# Patient Record
Sex: Male | Born: 1980 | Race: Black or African American | Hispanic: No | Smoking: Current every day smoker
Health system: Southern US, Community
[De-identification: ages and names within clinical notes are randomized; demographics above are authoritative.]

## PROBLEM LIST (undated history)

## (undated) HISTORY — PX: HERNIA REPAIR: SHX51

---

## 2014-07-10 ENCOUNTER — Encounter (HOSPITAL_BASED_OUTPATIENT_CLINIC_OR_DEPARTMENT_OTHER): Payer: Self-pay | Admitting: Emergency Medicine

## 2014-07-10 ENCOUNTER — Emergency Department (HOSPITAL_BASED_OUTPATIENT_CLINIC_OR_DEPARTMENT_OTHER): Payer: No Typology Code available for payment source

## 2014-07-10 ENCOUNTER — Emergency Department (HOSPITAL_BASED_OUTPATIENT_CLINIC_OR_DEPARTMENT_OTHER)
Admission: EM | Admit: 2014-07-10 | Discharge: 2014-07-10 | Disposition: A | Discharge status: Home / Self Care | Payer: No Typology Code available for payment source | Attending: Emergency Medicine | Admitting: Emergency Medicine

## 2014-07-10 DIAGNOSIS — Z79899 Other long term (current) drug therapy: Secondary | ICD-10-CM | POA: Insufficient documentation

## 2014-07-10 DIAGNOSIS — S0993XA Unspecified injury of face, initial encounter: Secondary | ICD-10-CM | POA: Insufficient documentation

## 2014-07-10 DIAGNOSIS — T07XXXA Unspecified multiple injuries, initial encounter: Secondary | ICD-10-CM | POA: Diagnosis not present

## 2014-07-10 DIAGNOSIS — F17200 Nicotine dependence, unspecified, uncomplicated: Secondary | ICD-10-CM | POA: Diagnosis not present

## 2014-07-10 DIAGNOSIS — Y9289 Other specified places as the place of occurrence of the external cause: Secondary | ICD-10-CM | POA: Diagnosis not present

## 2014-07-10 DIAGNOSIS — S46909A Unspecified injury of unspecified muscle, fascia and tendon at shoulder and upper arm level, unspecified arm, initial encounter: Secondary | ICD-10-CM | POA: Diagnosis not present

## 2014-07-10 DIAGNOSIS — Y9389 Activity, other specified: Secondary | ICD-10-CM | POA: Insufficient documentation

## 2014-07-10 DIAGNOSIS — Z791 Long term (current) use of non-steroidal anti-inflammatories (NSAID): Secondary | ICD-10-CM | POA: Insufficient documentation

## 2014-07-10 DIAGNOSIS — S199XXA Unspecified injury of neck, initial encounter: Secondary | ICD-10-CM

## 2014-07-10 DIAGNOSIS — IMO0002 Reserved for concepts with insufficient information to code with codable children: Secondary | ICD-10-CM | POA: Diagnosis present

## 2014-07-10 DIAGNOSIS — S39012A Strain of muscle, fascia and tendon of lower back, initial encounter: Secondary | ICD-10-CM

## 2014-07-10 DIAGNOSIS — S4980XA Other specified injuries of shoulder and upper arm, unspecified arm, initial encounter: Secondary | ICD-10-CM | POA: Diagnosis not present

## 2014-07-10 DIAGNOSIS — Z880 Allergy status to penicillin: Secondary | ICD-10-CM | POA: Insufficient documentation

## 2014-07-10 MED ORDER — NAPROXEN 500 MG PO TABS: 500.0000 mg | ORAL_TABLET | Freq: Two times a day (BID) | ORAL | Status: AC

## 2014-07-10 MED ORDER — IBUPROFEN 800 MG PO TABS
800.0000 mg | ORAL_TABLET | Freq: Once | ORAL | Status: AC
  Administered 2014-07-10: 800 mg via ORAL
  Filled 2014-07-10: qty 1

## 2014-07-10 MED ORDER — HYDROCODONE-ACETAMINOPHEN 5-325 MG PO TABS
1.0000 | ORAL_TABLET | Freq: Once | ORAL | Status: AC
  Administered 2014-07-10: 1 via ORAL
  Filled 2014-07-10: qty 1

## 2014-07-10 MED ORDER — IBUPROFEN 800 MG PO TABS: 800.0000 mg | ORAL_TABLET | Freq: Three times a day (TID) | ORAL | Status: AC

## 2014-07-10 MED ORDER — HYDROCODONE-ACETAMINOPHEN 5-325 MG PO TABS: 2.0000 | ORAL_TABLET | ORAL | Status: DC | PRN

## 2014-07-10 NOTE — Discharge Instructions (Signed)
Contusion °A contusion is a deep bruise. Contusions are the result of an injury that caused bleeding under the skin. The contusion may turn blue, purple, or yellow. Minor injuries will give you a painless contusion, but more severe contusions may stay painful and swollen for a few weeks.  °CAUSES  °A contusion is usually caused by a blow, trauma, or direct force to an area of the body. °SYMPTOMS  °· Swelling and redness of the injured area. °· Bruising of the injured area. °· Tenderness and soreness of the injured area. °· Pain. °DIAGNOSIS  °The diagnosis can be made by taking a history and physical exam. An X-ray, CT scan, or MRI may be needed to determine if there were any associated injuries, such as fractures. °TREATMENT  °Specific treatment will depend on what area of the body was injured. In general, the best treatment for a contusion is resting, icing, elevating, and applying cold compresses to the injured area. Over-the-counter medicines may also be recommended for pain control. Ask your caregiver what the best treatment is for your contusion. °HOME CARE INSTRUCTIONS  °· Put ice on the injured area. °¨ Put ice in a plastic bag. °¨ Place a towel between your skin and the bag. °¨ Leave the ice on for 15-20 minutes, 3-4 times a day, or as directed by your health care provider. °· Only take over-the-counter or prescription medicines for pain, discomfort, or fever as directed by your caregiver. Your caregiver may recommend avoiding anti-inflammatory medicines (aspirin, ibuprofen, and naproxen) for 48 hours because these medicines may increase bruising. °· Rest the injured area. °· If possible, elevate the injured area to reduce swelling. °SEEK IMMEDIATE MEDICAL CARE IF:  °· You have increased bruising or swelling. °· You have pain that is getting worse. °· Your swelling or pain is not relieved with medicines. °MAKE SURE YOU:  °· Understand these instructions. °· Will watch your condition. °· Will get help right  away if you are not doing well or get worse. °Document Released: 07/10/2005 Document Revised: 10/05/2013 Document Reviewed: 08/05/2011 °[employer]® Patient Information ©2015 [employer]. This information is not intended to replace advice given to you by your health care provider. Make sure you discuss any questions you have with your health care provider. °Lumbosacral Strain °Lumbosacral strain is a strain of any of the parts that make up your lumbosacral vertebrae. Your lumbosacral vertebrae are the bones that make up the lower third of your backbone. Your lumbosacral vertebrae are held together by muscles and tough, fibrous tissue (ligaments).  °CAUSES  °A sudden blow to your back can cause lumbosacral strain. Also, anything that causes an excessive stretch of the muscles in the low back can cause this strain. This is typically seen when people exert themselves strenuously, fall, lift heavy objects, bend, or crouch repeatedly. °RISK FACTORS °· Physically demanding work. °· Participation in pushing or pulling sports or sports that require a sudden twist of the back (tennis, golf, baseball). °· Weight lifting. °· Excessive lower back curvature. °· Forward-tilted pelvis. °· Weak back or abdominal muscles or both. °· Tight hamstrings. °SIGNS AND SYMPTOMS  °Lumbosacral strain may cause pain in the area of your injury or pain that moves (radiates) down your leg.  °DIAGNOSIS °Your health care provider can often diagnose lumbosacral strain through a physical exam. In some cases, you may need tests such as X-ray exams.  °TREATMENT  °Treatment for your lower back injury depends on many factors that your clinician will have to evaluate. However,   most treatment will include the use of anti-inflammatory medicines. °HOME CARE INSTRUCTIONS  °· Avoid hard physical activities (tennis, racquetball, waterskiing) if you are not in proper physical condition for it. This may aggravate or create problems. °· If you have a back problem,  avoid sports requiring sudden body movements. Swimming and walking are generally safer activities. °· Maintain good posture. °· Maintain a healthy weight. °· For acute conditions, you may put ice on the injured area. °¨ Put ice in a plastic bag. °¨ Place a towel between your skin and the bag. °¨ Leave the ice on for 20 minutes, 2-3 times a day. °· When the low back starts healing, stretching and strengthening exercises may be recommended. °SEEK MEDICAL CARE IF: °· Your back pain is getting worse. °· You experience severe back pain not relieved with medicines. °SEEK IMMEDIATE MEDICAL CARE IF:  °· You have numbness, tingling, weakness, or problems with the use of your arms or legs. °· There is a change in bowel or bladder control. °· You have increasing pain in any area of the body, including your belly (abdomen). °· You notice shortness of breath, dizziness, or feel faint. °· You feel sick to your stomach (nauseous), are throwing up (vomiting), or become sweaty. °· You notice discoloration of your toes or legs, or your feet get very cold. °MAKE SURE YOU:  °· Understand these instructions. °· Will watch your condition. °· Will get help right away if you are not doing well or get worse. °Document Released: 07/10/2005 Document Revised: 10/05/2013 Document Reviewed: 05/19/2013 °ExitCare® Patient Information ©2015 [employer]. This information is not intended to replace advice given to you by your health care provider. Make sure you discuss any questions you have with your health care provider. ° °
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
## 2014-07-10 NOTE — ED Notes (Signed)
Patient transported to X-ray 
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
## 2015-05-02 ENCOUNTER — Encounter (HOSPITAL_BASED_OUTPATIENT_CLINIC_OR_DEPARTMENT_OTHER): Payer: Self-pay | Admitting: *Deleted

## 2015-05-02 ENCOUNTER — Emergency Department (HOSPITAL_BASED_OUTPATIENT_CLINIC_OR_DEPARTMENT_OTHER)
Admission: EM | Admit: 2015-05-02 | Discharge: 2015-05-02 | Disposition: A | Discharge status: Home / Self Care | Payer: Non-veteran care | Attending: Emergency Medicine | Admitting: Emergency Medicine

## 2015-05-02 DIAGNOSIS — K4090 Unilateral inguinal hernia, without obstruction or gangrene, not specified as recurrent: Secondary | ICD-10-CM | POA: Diagnosis not present

## 2015-05-02 DIAGNOSIS — Z791 Long term (current) use of non-steroidal anti-inflammatories (NSAID): Secondary | ICD-10-CM | POA: Diagnosis not present

## 2015-05-02 DIAGNOSIS — Z880 Allergy status to penicillin: Secondary | ICD-10-CM | POA: Insufficient documentation

## 2015-05-02 DIAGNOSIS — Z720 Tobacco use: Secondary | ICD-10-CM | POA: Insufficient documentation

## 2015-05-02 DIAGNOSIS — R1904 Left lower quadrant abdominal swelling, mass and lump: Secondary | ICD-10-CM | POA: Diagnosis present

## 2015-05-02 LAB — URINALYSIS, ROUTINE W REFLEX MICROSCOPIC
Bilirubin Urine: NEGATIVE
GLUCOSE, UA: NEGATIVE mg/dL
HGB URINE DIPSTICK: NEGATIVE
Ketones, ur: NEGATIVE mg/dL
Leukocytes, UA: NEGATIVE
NITRITE: NEGATIVE
PROTEIN: NEGATIVE mg/dL
Specific Gravity, Urine: 1.027 (ref 1.005–1.030)
Urobilinogen, UA: 1 mg/dL (ref 0.0–1.0)
pH: 6 (ref 5.0–8.0)

## 2015-05-02 NOTE — ED Notes (Signed)
Patient asked to change into a gown.  

## 2015-05-02 NOTE — ED Provider Notes (Signed)
CSN: [id number]     Arrival date & time 05/02/15  1320 History   First MD Initiated Contact with Patient 05/02/15 1348     Chief Complaint  Patient presents with  . Abdominal Pain     (Consider location/radiation/quality/duration/timing/severity/associated sxs/prior Treatment) HPI  Pt presents with c/o swelling intermittently in right groin.  Pt states this area has been present and worsening over the past several weeks.  He states he was incarcerated recently and became constipated due to the food, he noted the area would bulge when he was straining to have bowel movement.  No swelling of scrotum.  Area swells when he lifts weights and carries heavy things.  The bulging does go down.  No significant pain associated.  No vomiting.  No fever/chills.  No dysuria or testicular pain.  He has a known umbilical hernia that he states swells sometimes but this new area swells more and bothers him more.  There are no other associated systemic symptoms, there are no other alleviating or modifying factors.   History reviewed. No pertinent past medical history. History reviewed. No pertinent past surgical history. No family history on file. History  Substance Use Topics  . Smoking status: Current Every Day Smoker -- 0.50 packs/day    Types: Cigarettes  . Smokeless tobacco: Not on file  . Alcohol Use: Yes    Review of Systems  ROS reviewed and all otherwise negative except for mentioned in HPI    Allergies  Amoxicillin  Home Medications   Prior to Admission medications   Medication Sig Start Date End Date Taking? Authorizing Provider  HYDROcodone-acetaminophen (NORCO/VICODIN) 5-325 MG per tablet Take 2 tablets by mouth every 4 (four) hours as needed. 07/10/14   Rolland PorterMark James, MD  ibuprofen (ADVIL,MOTRIN) 800 MG tablet Take 1 tablet (800 mg total) by mouth 3 (three) times daily. 07/10/14   Rolland PorterMark James, MD  naproxen (NAPROSYN) 500 MG tablet Take 1 tablet (500 mg total) by mouth 2 (two) times daily.  07/10/14   Rolland PorterMark James, MD   BP 132/86 mmHg  Pulse 72  Temp(Src) 98 F (36.7 C) (Oral)  Resp 18  Ht 5\' 10"  (1.778 m)  Wt 168 lb (76.204 kg)  BMI 24.11 kg/m2  SpO2 99%  Vitals reviewed Physical Exam  Physical Examination: General appearance - alert, well appearing, and in no distress Mental status - alert, oriented to person, place, and time Eyes - no conjunctival injection, no scleral icterus Mouth - mucous membranes moist, pharynx normal without lesions Chest - clear to auscultation, no wheezes, rales or rhonchi, symmetric air entry Heart - normal rate, regular rhythm, normal S1, S2, no murmurs, rubs, clicks or gallops Abdomen - soft, nontender, nondistended, no masses or organomegaly GU Male - hernia intermittently palpable with straining in right inguinal region, no penile lesions or discharge, no testicular masses or tenderness Neurological - alert, oriented x 3,  Extremities - peripheral pulses normal, no pedal edema, no clubbing or cyanosis Skin - normal coloration and turgor, no rashes  ED Course  Procedures (including critical care time) Labs Review Labs Reviewed  URINALYSIS, ROUTINE W REFLEX MICROSCOPIC (NOT AT [facility])    Imaging Review No results found.   EKG Interpretation None      MDM   Final diagnoses:  Inguinal hernia, right   Pt presenting with palpable hernia in right inguinal region.  This is easily reducible.  No abdominal tenderness or signs of incarcerated hernia.  D/w patient that he will need to have outpatient  followup with surgery.  Discharged with strict return precautions.  Pt agreeable with plan.    Jerelyn Scott, MD 05/03/15 [phone]

## 2015-05-02 NOTE — Discharge Instructions (Signed)
Return to the ED with any concerns including abdominal pain, hernia bulging and not able to be reduced, difficulty urinating, fever/chills, decreased level of alertness/lethargy, or any other alarming symptoms

## 2015-05-02 NOTE — ED Notes (Signed)
Abdominal pain. States he has a knot in his groin. He describes a hernia.

## 2015-05-17 ENCOUNTER — Emergency Department (HOSPITAL_COMMUNITY)
Admission: EM | Admit: 2015-05-17 | Discharge: 2015-05-17 | Disposition: A | Discharge status: Home / Self Care | Payer: Non-veteran care | Attending: Emergency Medicine | Admitting: Emergency Medicine

## 2015-05-17 ENCOUNTER — Encounter (HOSPITAL_COMMUNITY): Payer: Self-pay | Admitting: Cardiology

## 2015-05-17 DIAGNOSIS — K4090 Unilateral inguinal hernia, without obstruction or gangrene, not specified as recurrent: Secondary | ICD-10-CM | POA: Diagnosis not present

## 2015-05-17 DIAGNOSIS — N508 Other specified disorders of male genital organs: Secondary | ICD-10-CM | POA: Insufficient documentation

## 2015-05-17 DIAGNOSIS — Z720 Tobacco use: Secondary | ICD-10-CM | POA: Diagnosis not present

## 2015-05-17 DIAGNOSIS — Z791 Long term (current) use of non-steroidal anti-inflammatories (NSAID): Secondary | ICD-10-CM | POA: Insufficient documentation

## 2015-05-17 DIAGNOSIS — Z880 Allergy status to penicillin: Secondary | ICD-10-CM | POA: Diagnosis not present

## 2015-05-17 DIAGNOSIS — R109 Unspecified abdominal pain: Secondary | ICD-10-CM | POA: Diagnosis present

## 2015-05-17 DIAGNOSIS — R079 Chest pain, unspecified: Secondary | ICD-10-CM | POA: Insufficient documentation

## 2015-05-17 MED ORDER — MORPHINE SULFATE 4 MG/ML IJ SOLN
4.0000 mg | Freq: Once | INTRAMUSCULAR | Status: AC
  Administered 2015-05-17: 4 mg via INTRAVENOUS

## 2015-05-17 MED ORDER — MORPHINE SULFATE 4 MG/ML IJ SOLN
4.0000 mg | Freq: Once | INTRAMUSCULAR | Status: DC
  Filled 2015-05-17: qty 1

## 2015-05-17 MED ORDER — SODIUM CHLORIDE 0.9 % IV BOLUS (SEPSIS): 500.0000 mL | Freq: Once | INTRAVENOUS | Status: DC

## 2015-05-17 MED ORDER — HYDROCODONE-ACETAMINOPHEN 5-325 MG PO TABS: 2.0000 | ORAL_TABLET | Freq: Once | ORAL | Status: DC

## 2015-05-17 MED ORDER — HYDROCODONE-ACETAMINOPHEN 5-325 MG PO TABS: 2.0000 | ORAL_TABLET | ORAL | Status: AC | PRN

## 2015-05-17 NOTE — ED Notes (Signed)
Pt reports pain to the right groin with a bulging knot. States that he was told buy the [place] nurse that he has a hernia. Denies any urinary/bowel problems. Mild nausea.

## 2015-05-17 NOTE — ED Provider Notes (Signed)
CSN: [id number]     Arrival date & time 05/17/15  1213 History   First MD Initiated Contact with Patient 05/17/15 1426     Chief Complaint  Patient presents with  . Hernia     (Consider location/radiation/quality/duration/timing/severity/associated sxs/prior Treatment) HPI   Patient presents with right groin pain with recent hernia diagnosis. He had been experiencing right groin pain for several weeks, states that he felt a bulge after some straining, but the pain had been manageable until approximately 2 days ago. Over the last 2 days his hernia has gotten larger and more painful. He has a small unchanged hernia felt over his right groin and his right scrotum has gotten larger and more painful. He is currently working 2 jobs and does not do any strenuous lifting however just being on his feet has exacerbated the size and pain. He has had no fever or chills, no vomiting, diarrhea or nausea. He complains of a umbilical hernia which has been there for some time which is occasionally tender him in no acute issues with it right now.  He has not eaten today and is currently hungry.  History reviewed. No pertinent past medical history. History reviewed. No pertinent past surgical history. History reviewed. No pertinent family history. History  Substance Use Topics  . Smoking status: Current Every Day Smoker -- 0.50 packs/day    Types: Cigarettes  . Smokeless tobacco: Not on file  . Alcohol Use: Yes    Review of Systems  Constitutional: Negative.   HENT: Negative.   Eyes: Negative.   Respiratory: Negative.   Cardiovascular: Positive for chest pain.  Gastrointestinal: Negative.   Endocrine: Negative.   Genitourinary: Positive for scrotal swelling and testicular pain. Negative for dysuria, urgency, frequency, hematuria, flank pain, decreased urine volume, discharge, penile swelling, enuresis, difficulty urinating and penile pain.  Musculoskeletal: Negative.   Skin: Negative.   Neurological:  Negative.   Psychiatric/Behavioral: Negative.       Allergies  Amoxicillin  Home Medications   Prior to Admission medications   Medication Sig Start Date End Date Taking? Authorizing Provider  HYDROcodone-acetaminophen (NORCO/VICODIN) 5-325 MG per tablet Take 2 tablets by mouth every 4 (four) hours as needed. 07/10/14   Rolland Porter, MD  ibuprofen (ADVIL,MOTRIN) 800 MG tablet Take 1 tablet (800 mg total) by mouth 3 (three) times daily. 07/10/14   Rolland Porter, MD  naproxen (NAPROSYN) 500 MG tablet Take 1 tablet (500 mg total) by mouth 2 (two) times daily. 07/10/14   Rolland Porter, MD   BP 123/87 mmHg  Pulse 64  Temp(Src) 97.8 F (36.6 C)  Resp 20  SpO2 100% Physical Exam  Constitutional: He is oriented to person, place, and time. He appears well-developed and well-nourished. No distress.  HENT:  Head: Normocephalic and atraumatic.  Nose: Nose normal.  Mouth/Throat: Oropharynx is clear and moist. No oropharyngeal exudate.  Eyes: Conjunctivae and EOM are normal. Pupils are equal, round, and reactive to light. Right eye exhibits no discharge. Left eye exhibits no discharge. No scleral icterus.  Neck: Normal range of motion. No JVD present. No tracheal deviation present. No thyromegaly present.  Cardiovascular: Normal rate, regular rhythm, normal heart sounds and intact distal pulses.  Exam reveals no gallop and no friction rub.   No murmur heard. Pulmonary/Chest: Effort normal and breath sounds normal. No respiratory distress. He has no wheezes. He has no rales. He exhibits no tenderness.  Abdominal: Soft. Bowel sounds are normal. He exhibits no distension and no mass. There is  no tenderness. There is no rebound and no guarding. A hernia is present. Hernia confirmed positive in the right inguinal area. Hernia confirmed negative in the left inguinal area.  Abdomen soft, nondistended, bowel sounds 4, no tenderness, rebound, guarding in all 4 quadrants. Palpable mass approximately 1-2 cm in size  in his inguinal canal, tender to palpation  Genitourinary: Penis normal. Right testis shows mass, swelling and tenderness. Circumcised. No penile erythema or penile tenderness. No discharge found.  Musculoskeletal: Normal range of motion. He exhibits no edema or tenderness.  Lymphadenopathy:    He has no cervical adenopathy.       Right: No inguinal adenopathy present.       Left: No inguinal adenopathy present.  Neurological: He is alert and oriented to person, place, and time. He has normal reflexes. No cranial nerve deficit. He exhibits normal muscle tone. Coordination normal.  Skin: Skin is warm and dry. No rash noted. He is not diaphoretic. No erythema. No pallor.  Psychiatric: He has a normal mood and affect. His behavior is normal. Judgment and thought content normal.  Nursing note and vitals reviewed.   ED Course  Procedures (including critical care time) Labs Review Labs Reviewed - No data to display  Imaging Review No results found.   EKG Interpretation None      MDM   Final diagnoses:  None   Pt with right inguinal hernia, states increasing pain and size over the past two days - no N,V, F, C Pt placed in Trendelenburg, with decrease in pain and size Suspect hernia combined direct and indirect - inguinal mass gone, exam reveals no hernia present in right scrotum, though defect felt on exam Will give pt gen surgery f/u, [facility]. Patient has been given return precautions and verbalized understanding Pt given pain meds, IVF, successful PO trial here in the ER and deemed stable for discharge.      Danelle Berry, PA-C 05/23/15 1351  Elwin Mocha, MD 05/26/15 [phone]

## 2015-05-17 NOTE — Discharge Instructions (Signed)
Hernia °A hernia happens when an organ inside your body pushes out through a weak spot in your belly (abdominal) wall. Most hernias get worse over time. They can often be pushed back into place (reduced). Surgery may be needed to repair hernias that cannot be pushed into place. °HOME CARE °· Keep doing normal activities. °· Avoid lifting more than 10 pounds (4.5 kilograms). °· Cough gently and avoid straining. Over time, these things will: °¨ Increase your hernia size. °¨ Irritate your hernia. °¨ Break down hernia repairs. °· Stop smoking. °· Do not wear anything tight over your hernia. Do not keep the hernia in with an outside bandage. °· Eat food that is high in fiber (fruit, vegetables, whole grains). °· Drink enough fluids to keep your pee (urine) clear or pale yellow. °· Take medicines to make your poop soft (stool softeners) if you cannot poop (constipated). °GET HELP RIGHT AWAY IF:  °· You have a fever. °· You have belly pain that gets worse. °· You feel sick to your stomach (nauseous) and throw up (vomit). °· Your skin starts to bulge out. °· Your hernia turns a different color, feels hard, or is tender. °· You have increased pain or puffiness (swelling) around the hernia. °· You poop more or less often. °· Your poop does not look the way normally does. °· You have watery poop (diarrhea). °· You cannot push the hernia back in place by applying gentle pressure while lying down. °MAKE SURE YOU:  °· Understand these instructions. °· Will watch your condition. °· Will get help right away if you are not doing well or get worse. °Document Released: 03/20/2010 Document Revised: 12/23/2011 Document Reviewed: 03/20/2010 °Exit[employer]® Patient Information ©2015 [employer]. This information is not intended to replace advice given to you by your health care provider. Make sure you discuss any questions you have with your health care provider. ° °Inguinal Hernia, Adult °Muscles help keep everything in the body in its  proper place. But if a weak spot in the muscles develops, something can poke through. That is called a hernia. When this happens in the lower part of the belly (abdomen), it is called an inguinal hernia. (It takes its name from a part of the body in this region called the inguinal canal.) A weak spot in the wall of muscles lets some fat or part of the small intestine bulge through. An inguinal hernia can develop at any age. Men get them more often than women. °CAUSES  °In adults, an inguinal hernia develops over time. °· It can be triggered by: °¨ Suddenly straining the muscles of the lower abdomen. °¨ Lifting heavy objects. °¨ Straining to have a bowel movement. Difficult bowel movements (constipation) can lead to this. °¨ Constant coughing. This may be caused by smoking or lung disease. °¨ Being overweight. °¨ Being pregnant. °¨ Working at a job that requires long periods of standing or heavy lifting. °¨ Having had an inguinal hernia before. °One type can be an emergency situation. It is called a strangulated inguinal hernia. It develops if part of the small intestine slips through the weak spot and cannot get back into the abdomen. The blood supply can be cut off. If that happens, part of the intestine may die. This situation requires emergency surgery. °SYMPTOMS  °Often, a small inguinal hernia has no symptoms. It is found when a healthcare provider does a physical exam. Larger hernias usually have symptoms.  °· In adults, symptoms may include: °¨ A lump   in the groin. This is easier to see when the person is standing. It might disappear when lying down. °¨ In men, a lump in the scrotum. °¨ Pain or burning in the groin. This occurs especially when lifting, straining or coughing. °¨ A dull ache or feeling of pressure in the groin. °· Signs of a strangulated hernia can include: °¨ A bulge in the groin that becomes very painful and tender to the touch. °¨ A bulge that turns red or purple. °¨ Fever, nausea and  vomiting. °¨ Inability to have a bowel movement or to pass gas. °DIAGNOSIS  °To decide if you have an inguinal hernia, a healthcare provider will probably do a physical examination. °· This will include asking questions about any symptoms you have noticed. °· The healthcare provider might feel the groin area and ask you to cough. If an inguinal hernia is felt, the healthcare provider may try to slide it back into the abdomen. °· Usually no other tests are needed. °TREATMENT  °Treatments can vary. The size of the hernia makes a difference. Options include: °· Watchful waiting. This is often suggested if the hernia is small and you have had no symptoms. °¨ No medical procedure will be done unless symptoms develop. °¨ You will need to watch closely for symptoms. If any occur, contact your healthcare provider right away. °· Surgery. This is used if the hernia is larger or you have symptoms. °¨ Open surgery. This is usually an outpatient procedure (you will not stay overnight in a hospital). An cut (incision) is made through the skin in the groin. The hernia is put back inside the abdomen. The weak area in the muscles is then repaired by herniorrhaphy or hernioplasty. Herniorrhaphy: in this type of surgery, the weak muscles are sewn back together. Hernioplasty: a patch or mesh is used to close the weak area in the abdominal wall. °¨ Laparoscopy. In this procedure, a surgeon makes small incisions. A thin tube with a tiny video camera (called a laparoscope) is put into the abdomen. The surgeon repairs the hernia with mesh by looking with the video camera and using two long instruments. °HOME CARE INSTRUCTIONS  °· After surgery to repair an inguinal hernia: °¨ You will need to take pain medicine prescribed by your healthcare provider. Follow all directions carefully. °¨ You will need to take care of the wound from the incision. °¨ Your activity will be restricted for awhile. This will probably include no heavy lifting for  several weeks. You also should not do anything too active for a few weeks. When you can return to work will depend on the type of job that you have. °· During "watchful waiting" periods, you should: °¨ Maintain a healthy weight. °¨ Eat a diet high in fiber (fruits, vegetables and whole grains). °¨ Drink plenty of fluids to avoid constipation. This means drinking enough water and other liquids to keep your urine clear or pale yellow. °¨ Do not lift heavy objects. °¨ Do not stand for long periods of time. °¨ Quit smoking. This should keep you from developing a frequent cough. °SEEK MEDICAL CARE IF:  °· A bulge develops in your groin area. °· You feel pain, a burning sensation or pressure in the groin. This might be worse if you are lifting or straining. °· You develop a fever of more than 100.5° F (38.1° C). °SEEK IMMEDIATE MEDICAL CARE IF:  °· Pain in the groin increases suddenly. °· A bulge in the groin gets bigger   suddenly and does not go down. °· For men, there is sudden pain in the scrotum. Or, the size of the scrotum increases. °· A bulge in the groin area becomes red or purple and is painful to touch. °· You have nausea or vomiting that does not go away. °· You feel your heart beating much faster than normal. °· You cannot have a bowel movement or pass gas. °· You develop a fever of more than 102.0° F (38.9° C). °Document Released: 02/16/2009 Document Revised: 12/23/2011 Document Reviewed: 02/16/2009 °ExitCare® Patient Information ©2015 [employer]. This information is not intended to replace advice given to you by your health care provider. Make sure you discuss any questions you have with your health care provider. ° °

## 2015-05-17 NOTE — ED Notes (Signed)
Dcd with instructions verb understanding. Pt alert x4 respirations easy non labored. Instructed not to drive agreeable.

## 2015-06-18 ENCOUNTER — Emergency Department (HOSPITAL_BASED_OUTPATIENT_CLINIC_OR_DEPARTMENT_OTHER)
Admission: EM | Admit: 2015-06-18 | Discharge: 2015-06-18 | Disposition: A | Discharge status: Home / Self Care | Payer: Non-veteran care | Attending: Emergency Medicine | Admitting: Emergency Medicine

## 2015-06-18 ENCOUNTER — Encounter (HOSPITAL_BASED_OUTPATIENT_CLINIC_OR_DEPARTMENT_OTHER): Payer: Self-pay

## 2015-06-18 DIAGNOSIS — Z720 Tobacco use: Secondary | ICD-10-CM | POA: Diagnosis not present

## 2015-06-18 DIAGNOSIS — Z4801 Encounter for change or removal of surgical wound dressing: Secondary | ICD-10-CM | POA: Insufficient documentation

## 2015-06-18 DIAGNOSIS — Z4889 Encounter for other specified surgical aftercare: Secondary | ICD-10-CM

## 2015-06-18 DIAGNOSIS — Z880 Allergy status to penicillin: Secondary | ICD-10-CM | POA: Diagnosis not present

## 2015-06-18 MED ORDER — HYDROCODONE-ACETAMINOPHEN 5-325 MG PO TABS: 1.0000 | ORAL_TABLET | Freq: Four times a day (QID) | ORAL | Status: AC | PRN

## 2015-06-18 NOTE — ED Notes (Signed)
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 Skin appears to be intact. No drainage.

## 2015-06-18 NOTE — Discharge Instructions (Signed)
No heavy lifting until you've been re-evaluated by your surgeon.  Incision Care An incision is when a surgeon cuts into your body tissues. After surgery, the incision needs to be cared for properly to prevent infection.  HOME CARE INSTRUCTIONS   Take all medicine as directed by your caregiver. Only take over-the-counter or prescription medicines for pain, discomfort, or fever as directed by your caregiver.  Do not remove your bandage (dressing) or get your incision wet until your surgeon gives you permission. In the event that your dressing becomes wet, dirty, or starts to smell, change the dressing and call your surgeon for instructions as soon as possible.  Take showers. Do not take tub baths, swim, or do anything that may soak the wound until it is healed.  Resume your normal diet and activities as directed or allowed.  Avoid lifting any weight until you are instructed otherwise.  Use anti-itch antihistamine medicine as directed by your caregiver. The wound may itch when it is healing. Do not pick or scratch at the wound.  Follow up with your caregiver for stitch (suture) or staple removal as directed.  Drink enough fluids to keep your urine clear or pale yellow. SEEK MEDICAL CARE IF:   You have redness, swelling, or increasing pain in the wound that is not controlled with medicine.  You have drainage, blood, or pus coming from the wound that lasts longer than 1 day.  You develop muscle aches, chills, or a general ill feeling.  You notice a bad smell coming from the wound or dressing.  Your wound edges separate after the sutures, staples, or skin adhesive strips have been removed.  You develop persistent nausea or vomiting. SEEK IMMEDIATE MEDICAL CARE IF:   You have a fever.  You develop a rash.  You develop dizzy episodes or faint while standing.  You have difficulty breathing.  You develop any reaction or side effects to medicine given. MAKE SURE YOU:   Understand  these instructions.  Will watch your condition.  Will get help right away if you are not doing well or get worse. Document Released: 04/19/2005 Document Revised: 12/23/2011 Document Reviewed: 11/24/2013 [facility] Patient Information 2015 [place], [place]. This information is not intended to replace advice given to you by your health care provider. Make sure you discuss any questions you have with your health care provider.

## 2015-06-18 NOTE — ED Provider Notes (Signed)
CSN: [id number]     Arrival date & time 06/18/15  1526 History  This chart was scribed for Elwin Mocha, MD by Lyndel Safe, ED Scribe. This patient was seen in room MHFT1/MHFT1 and the patient's care was started 4:01 PM.   Chief Complaint  Patient presents with  . Wound Dehiscence   Patient is a 34 y.o. male presenting with wound check. The history is provided by the patient. No language interpreter was used.  Wound Check This is a new (surgical site) problem. The current episode started yesterday. The problem occurs constantly. The problem has not changed since onset.Nothing aggravates the symptoms. Nothing relieves the symptoms. He has tried nothing for the symptoms. The treatment provided no relief.   HPI Comments: Roberto Tanner is a 34 y.o. male who presents to the Emergency Department complaining of constant, moderate right groin pain to a surgical site onset 1 day ago s/p right inguinal hernia repair. The pt had hernia repair surgery several weeks ago. He had 2 staples removed 2 days ago from surgical site in right groin. 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 He denies fevers, the site to be bulging or drainage from the area.   History reviewed. No pertinent past medical history. Past Surgical History  Procedure Laterality Date  . Hernia repair     History reviewed. No pertinent family history. Social History  Substance Use Topics  . Smoking status: Current Every Day Smoker -- 0.50 packs/day    Types: Cigarettes  . Smokeless tobacco: None  . Alcohol Use: Yes    Review of Systems  Constitutional: Negative for fever.  Skin: Positive for wound.  All other systems reviewed and are negative.  Allergies  Amoxicillin  Home Medications   Prior to Admission medications   Medication Sig Start Date End Date Taking? Authorizing Provider  HYDROcodone-acetaminophen (NORCO/VICODIN) 5-325 MG per  tablet Take 2 tablets by mouth every 4 (four) hours as needed. 05/17/15   Danelle Berry, PA-C  ibuprofen (ADVIL,MOTRIN) 200 MG tablet Take 1,200 mg by mouth every 6 (six) hours as needed for mild pain.    Historical Provider, MD  ibuprofen (ADVIL,MOTRIN) 800 MG tablet Take 1 tablet (800 mg total) by mouth 3 (three) times daily. Patient not taking: Reported on 05/17/2015 07/10/14   Rolland Porter, MD  naproxen (NAPROSYN) 500 MG tablet Take 1 tablet (500 mg total) by mouth 2 (two) times daily. Patient not taking: Reported on 05/17/2015 07/10/14   Rolland Porter, MD   BP 137/71 mmHg  Pulse 81  Temp(Src) 98.6 F (37 C) (Oral)  Resp 16  Ht 5\' 10"  (1.778 m)  Wt 174 lb (78.926 kg)  BMI 24.97 kg/m2  SpO2 98% Physical Exam  Constitutional: He is oriented to person, place, and time. He appears well-developed and well-nourished. No distress.  HENT:  Head: Normocephalic and atraumatic.  Mouth/Throat: No oropharyngeal exudate.  Eyes: EOM are normal. Pupils are equal, round, and reactive to light.  Neck: Normal range of motion. Neck supple.  Cardiovascular: Normal rate and regular rhythm.  Exam reveals no friction rub.   No murmur heard. Pulmonary/Chest: Effort normal and breath sounds normal. No respiratory distress. He has no wheezes. He has no rales.  Abdominal: He exhibits no distension. There is no tenderness. There is no rebound.  R inguinal hernia surgical site c/d/i. Wound has <1 mm of splitting in the center of the  wound. No redness or erythema. No bulging or masses.  Musculoskeletal: Normal range of motion. He exhibits no edema.  Neurological: He is alert and oriented to person, place, and time.  Skin: He is not diaphoretic.  Nursing note and vitals reviewed.   ED Course  Procedures  DIAGNOSTIC STUDIES: Oxygen Saturation is 98% on RA, normal by my interpretation.    COORDINATION OF CARE: 4:04 PM Discussed treatment plan with pt. Pt acknowledges and agrees to plan.   Labs Review Labs Reviewed -  No data to display  Imaging Review No results found. I have personally reviewed and evaluated these images and lab results as part of my medical decision-making.   EKG Interpretation None      MDM   Final diagnoses:  Encounter for postoperative wound check    66M here with concerns of wound dehiscence. He felt his wound "pop" after picking up drywall. No pain. No swelling in the scrotum or testicles. Here wound well appearing, small <68mm split in center of wound, no drainage or swelling. Normal GU exam. Will put steri-strips on the wound and have him f/u with surgery.  I personally performed the services described in this documentation, which was scribed in my presence. The recorded information has been reviewed and is accurate.     Elwin Mocha, MD 06/21/15 [phone]
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
